# Patient Record
Sex: Female | Born: 2000 | Race: Black or African American | Hispanic: No | Marital: Single
Health system: Southern US, Community
[De-identification: ages and names within clinical notes are randomized; demographics above are authoritative.]

---

## 2021-04-30 DIAGNOSIS — Z020 Encounter for examination for admission to educational institution: Secondary | ICD-10-CM | POA: Diagnosis not present

## 2021-04-30 DIAGNOSIS — Z111 Encounter for screening for respiratory tuberculosis: Secondary | ICD-10-CM | POA: Diagnosis not present

## 2021-05-02 ENCOUNTER — Emergency Department (HOSPITAL_BASED_OUTPATIENT_CLINIC_OR_DEPARTMENT_OTHER)
Admission: EM | Admit: 2021-05-02 | Discharge: 2021-05-02 | Disposition: A | Discharge status: Home / Self Care | Payer: Medicaid Other | Attending: Emergency Medicine | Admitting: Emergency Medicine

## 2021-05-02 ENCOUNTER — Emergency Department (HOSPITAL_BASED_OUTPATIENT_CLINIC_OR_DEPARTMENT_OTHER): Payer: Medicaid Other | Admitting: Radiology

## 2021-05-02 ENCOUNTER — Other Ambulatory Visit: Payer: Self-pay

## 2021-05-02 ENCOUNTER — Encounter (HOSPITAL_BASED_OUTPATIENT_CLINIC_OR_DEPARTMENT_OTHER): Payer: Self-pay | Admitting: Emergency Medicine

## 2021-05-02 DIAGNOSIS — R7611 Nonspecific reaction to tuberculin skin test without active tuberculosis: Secondary | ICD-10-CM | POA: Insufficient documentation

## 2021-05-02 NOTE — ED Triage Notes (Signed)
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

## 2021-05-02 NOTE — ED Provider Notes (Signed)
[facility] EMERGENCY DEPT Provider Note   CSN: [id number] Arrival date & time: 05/02/21  1601     History Chief Complaint  Patient presents with   PPD Reading    Jennifer Barry is a 20 y.o. female.  Patient presents with concern for positive PPD test.  She had this test done 2 days ago had a red [facility], she was told it was positive and go to the ER.  She otherwise denies any recent illnesses no fever no cough no night sweats no vomiting or shortness of breath.  No recent travel.  No prior history of TB.  No contact with individuals with TB to her knowledge.      History reviewed. No pertinent past medical history.  There are no problems to display for this patient.   History reviewed. No pertinent surgical history.   OB History   No obstetric history on file.     No family history on file.     Home Medications Prior to Admission medications   Not on File    Allergies    Patient has no allergy information on record.  Review of Systems   Review of Systems  Constitutional:  Negative for fever.  HENT:  Negative for ear pain.   Eyes:  Negative for pain.  Respiratory:  Negative for cough.   Cardiovascular:  Negative for chest pain.  Gastrointestinal:  Negative for abdominal pain.  Genitourinary:  Negative for flank pain.  Musculoskeletal:  Negative for back pain.  Skin:  Negative for rash.  Neurological:  Negative for headaches.   Physical Exam Updated Vital Signs BP 130/80 (BP Location: Right Arm)   Pulse (!) 57   Temp 98.6 F (37 C) (Oral)   Resp 18   Ht 5\' 6"  (1.676 m)   Wt 65.3 kg   LMP 04/21/2021   SpO2 100%   BMI 23.24 kg/m   Physical Exam Constitutional:      General: She is not in acute distress.    Appearance: Normal appearance.  HENT:     Head: Normocephalic.     Nose: Nose normal.  Eyes:     Extraocular Movements: Extraocular movements intact.  Cardiovascular:     Rate and Rhythm: Normal rate.  Pulmonary:      Effort: Pulmonary effort is normal.  Musculoskeletal:        General: Normal range of motion.     Cervical back: Normal range of motion.  Skin:    Comments: Mild induration at the right forearm at her PPD test site.  Neurological:     General: No focal deficit present.     Mental Status: She is alert. Mental status is at baseline.    ED Results / Procedures / Treatments   Labs (all labs ordered are listed, but only abnormal results are displayed) Labs Reviewed - No data to display  EKG None  Radiology DG Chest 2 View  Result Date: 05/02/2021 CLINICAL DATA:  Positive PPD EXAM: CHEST - 2 VIEW COMPARISON:  None. FINDINGS: The heart size and mediastinal contours are within normal limits. Both lungs are clear. The visualized skeletal structures are unremarkable. IMPRESSION: No active cardiopulmonary disease. Electronically Signed   By: 07/03/2021 M.D.   On: 05/02/2021 17:17    Procedures Procedures   Medications Ordered in ED Medications - No data to display  ED Course  I have reviewed the triage vital signs and the nursing notes.  Pertinent labs & imaging results that  were available during my care of the patient were reviewed by me and considered in my medical decision making (see chart for details).    MDM Rules/Calculators/A&P                          Patient medically cleared.  Advised follow-up with her doctor as needed.   Final Clinical Impression(s) / ED Diagnoses Final diagnoses:  Positive PPD    Rx / DC Orders ED Discharge Orders     None        Cheryll Cockayne, MD 05/02/21 1729

## 2022-06-28 IMAGING — DX DG CHEST 2V
2 series · 2 of 2 positions shown · non-contrast
Comparison: None.

CLINICAL DATA: Positive PPD

EXAM:
CHEST - 2 VIEW

[chest pa]
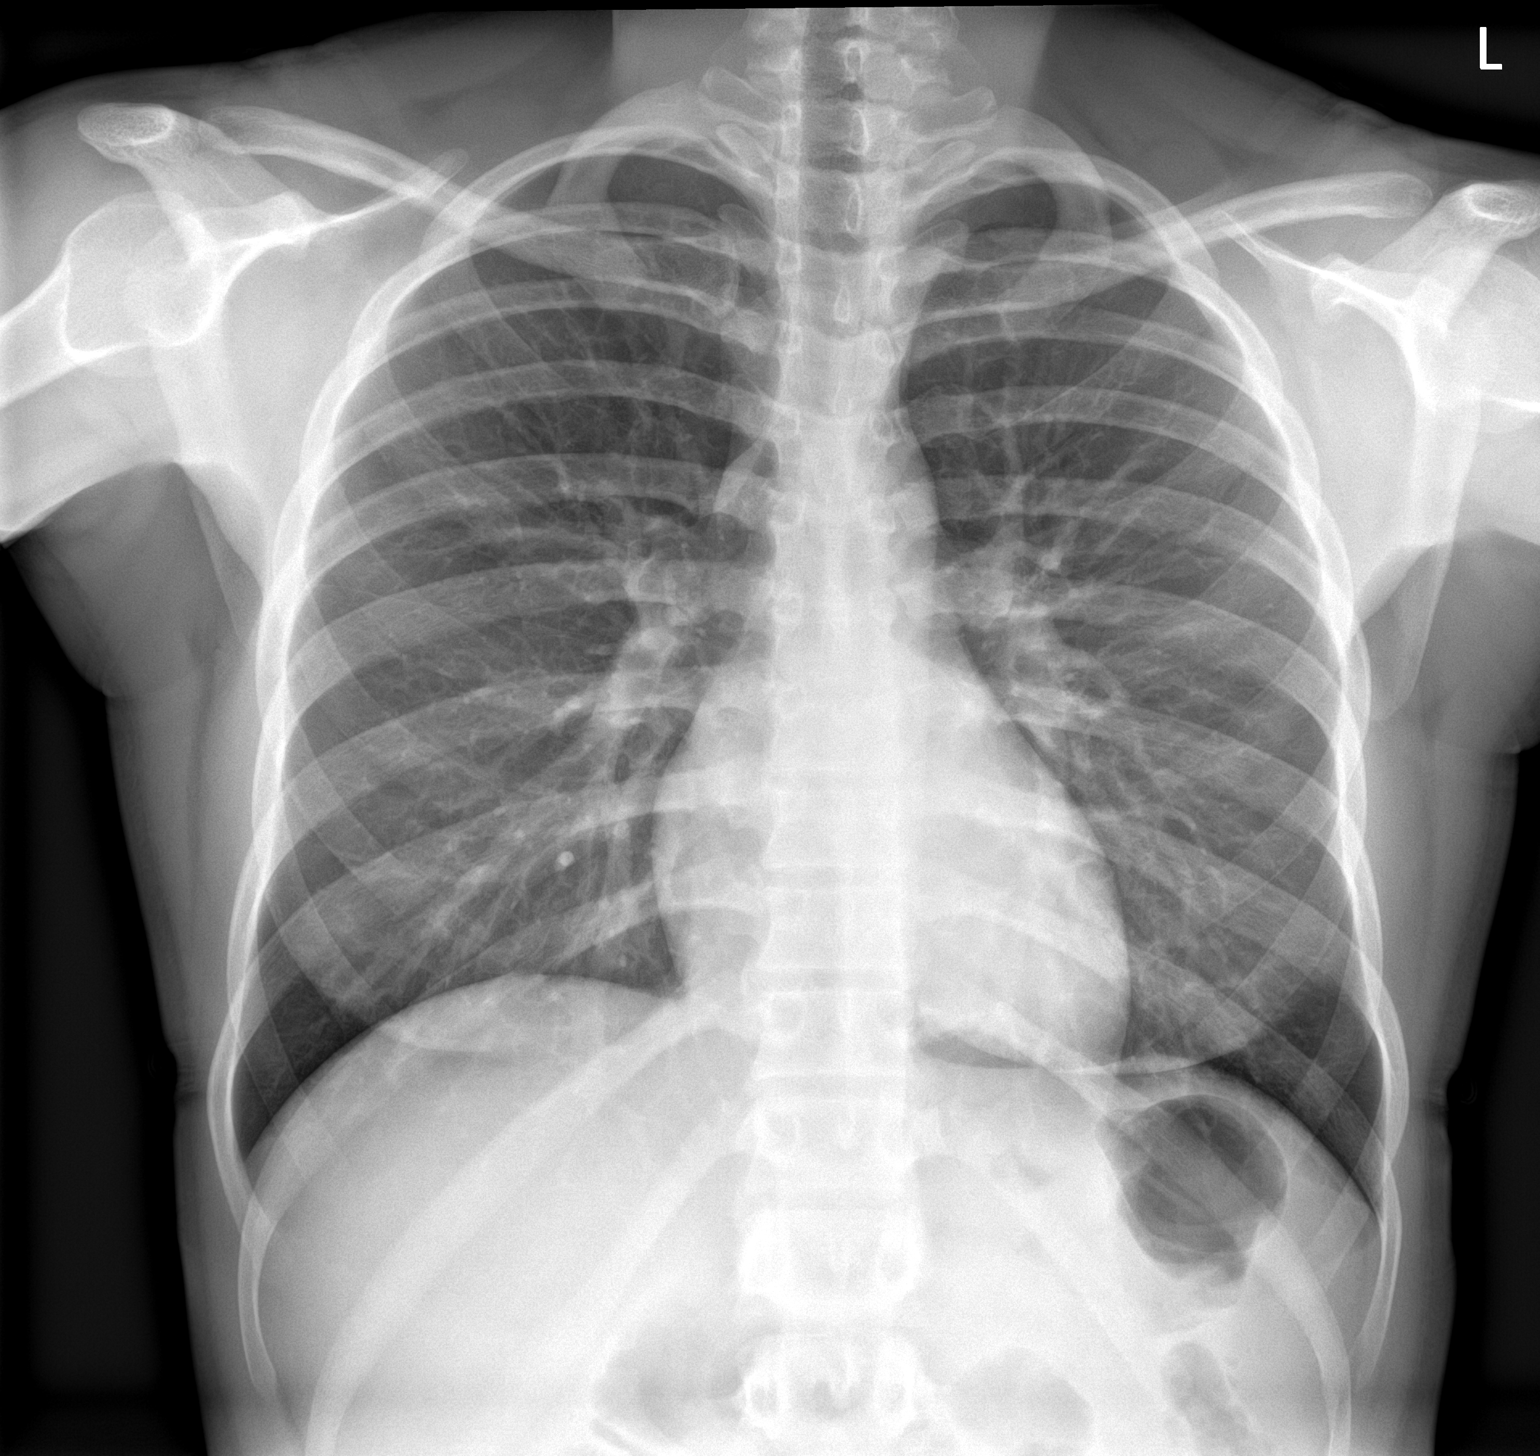

[chest lat]
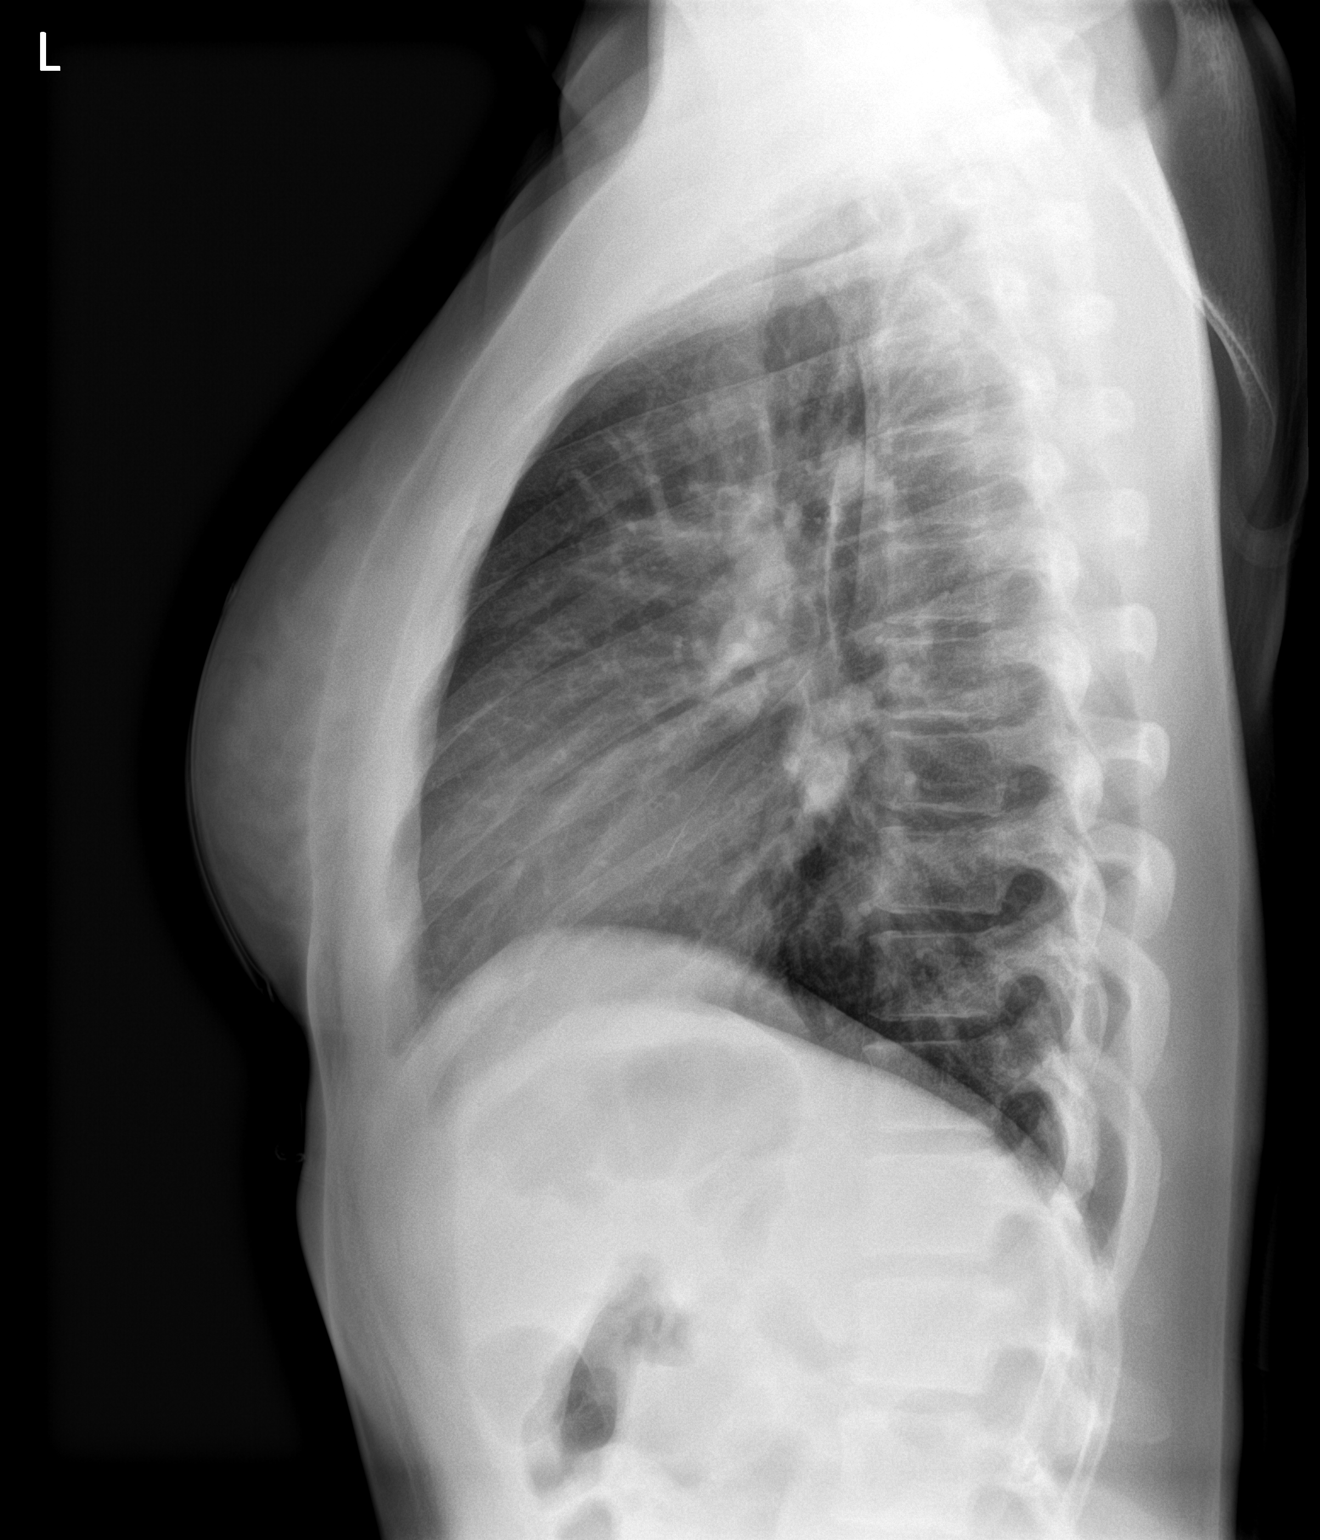

[2 of 2 positions shown; findings below may reference images not displayed]

FINDINGS: The heart size and mediastinal contours are within normal limits.
Both lungs are clear. The visualized skeletal structures are
unremarkable.
IMPRESSION: No active cardiopulmonary disease.

## 2023-04-14 ENCOUNTER — Telehealth: Payer: Medicaid Other | Admitting: Nurse Practitioner

## 2023-04-14 DIAGNOSIS — L2084 Intrinsic (allergic) eczema: Secondary | ICD-10-CM | POA: Diagnosis not present

## 2023-04-14 MED ORDER — CETIRIZINE HCL 10 MG PO TABS: 10.0000 mg | ORAL_TABLET | Freq: Every day | ORAL | 2 refills | Status: AC

## 2023-04-14 MED ORDER — TRIAMCINOLONE ACETONIDE 0.1 % EX CREA: 1.0000 | TOPICAL_CREAM | Freq: Two times a day (BID) | CUTANEOUS | 0 refills | Status: AC

## 2023-04-14 NOTE — Progress Notes (Signed)
Virtual Visit Consent   Jennifer Barry, you are scheduled for a virtual visit with a [place] provider today. Just as with appointments in the office, your consent must be obtained to participate. Your consent will be active for this visit and any virtual visit you may have with one of our providers in the next 365 days. If you have a MyChart account, a copy of this consent can be sent to you electronically.  As this is a virtual visit, video technology does not allow for your provider to perform a traditional examination. This may limit your provider's ability to fully assess your condition. If your provider identifies any concerns that need to be evaluated in person or the need to arrange testing (such as labs, EKG, etc.), we will make arrangements to do so. Although advances in technology are sophisticated, we cannot ensure that it will always work on either your end or our end. If the connection with a video visit is poor, the visit may have to be switched to a telephone visit. With either a video or telephone visit, we are not always able to ensure that we have a secure connection.  By engaging in this virtual visit, you consent to the provision of healthcare and authorize for your insurance to be billed (if applicable) for the services provided during this visit. Depending on your insurance coverage, you may receive a charge related to this service.  I need to obtain your verbal consent now. Are you willing to proceed with your visit today? Jennifer Barry has provided verbal consent on 04/14/2023 for a virtual visit (video or telephone). Viviano Simas, FNP  Date: 04/14/2023 3:16 PM  Virtual Visit via Video Note   I, Viviano Simas, connected with  Jennifer Barry  ([id number], 2000-12-09) on 04/14/23 at  3:15 PM EDT by a video-enabled telemedicine application and verified that I am speaking with the correct person using two identifiers.  Location: Patient: Virtual Visit Location Patient:  Home Provider: Virtual Visit Location Provider: Home Office   I discussed the limitations of evaluation and management by telemedicine and the availability of in person appointments. The patient expressed understanding and agreed to proceed.    History of Present Illness: Jennifer Barry is a 22 y.o. who identifies as a female who was assigned female at birth, and is being seen today for a rash   She has marks on the inside of her elbows, under her arms and on her chest  The skin in those areas is also discolored   She used nair last year around November and had a reaction to that in her underarms she did not have treatment for it but has been intermittently itching since that time in her armpits   She has recently started to have the spread of the rash to her inner elbows and chest that started as itching and bumps with discoloration appear after she scratches   Denies a history of allergies or asthma or eczema  She is from [place]  Will notice sneezing more when the pollen count is high   Observations/Objective: Patient is well-developed, well-nourished in no acute distress.  Resting comfortably  at home.  Head is normocephalic, atraumatic.  No labored breathing.  Speech is clear and coherent with logical content.  Patient is alert and oriented at baseline.  Darkened patch to inner elbow visualized with papules dispersed skin intact   Assessment and Plan: 1. Intrinsic eczema  - cetirizine (ZYRTEC ALLERGY) 10 MG tablet; Take 1 tablet (10  mg total) by mouth at bedtime.  Dispense: 30 tablet; Refill: 2 - triamcinolone cream (KENALOG) 0.1 %; Apply 1 Application topically 2 (two) times daily for 14 days.  Dispense: 30 g; Refill: 0     Follow Up Instructions: I discussed the assessment and treatment plan with the patient. The patient was provided an opportunity to ask questions and all were answered. The patient agreed with the plan and demonstrated an understanding of the  instructions.  A copy of instructions were sent to the patient via MyChart unless otherwise noted below.    The patient was advised to call back or seek an in-person evaluation if the symptoms worsen or if the condition fails to improve as anticipated.  Time:  I spent 15 minutes with the patient via telehealth technology discussing the above problems/concerns.    Viviano Simas, FNP
